# Patient Record
Sex: Female | Born: 1990 | Race: White | Hispanic: No | Marital: Single | Smoking: Light tobacco smoker
Health system: Southern US, Community
[De-identification: ages and names within clinical notes are randomized; demographics above are authoritative.]

## PROBLEM LIST (undated history)

## (undated) DIAGNOSIS — F329 Major depressive disorder, single episode, unspecified: Secondary | ICD-10-CM

## (undated) DIAGNOSIS — F32A Depression, unspecified: Secondary | ICD-10-CM

## (undated) DIAGNOSIS — F419 Anxiety disorder, unspecified: Secondary | ICD-10-CM

## (undated) HISTORY — DX: Major depressive disorder, single episode, unspecified: F32.9

## (undated) HISTORY — PX: OTHER SURGICAL HISTORY: SHX169

## (undated) HISTORY — DX: Depression, unspecified: F32.A

## (undated) HISTORY — PX: FRACTURE SURGERY: SHX138

---
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
## 2009-07-09 ENCOUNTER — Emergency Department (HOSPITAL_COMMUNITY): Admission: EM | Admit: 2009-07-09 | Discharge: 2009-07-09 | Payer: Self-pay | Admitting: Family Medicine

## 2010-12-14 LAB — DIFFERENTIAL
Eosinophils Absolute: 0.1
Lymphs Abs: 1.5
Neutrophils Relative %: 85 — ABNORMAL HIGH

## 2010-12-14 LAB — URINALYSIS, ROUTINE W REFLEX MICROSCOPIC
Leukocytes, UA: NEGATIVE
Nitrite: NEGATIVE
Specific Gravity, Urine: 1.017
Urobilinogen, UA: 0.2

## 2010-12-14 LAB — PREGNANCY, URINE: Preg Test, Ur: NEGATIVE

## 2010-12-14 LAB — BASIC METABOLIC PANEL
BUN: 8
Calcium: 9.7
Chloride: 101
Creatinine, Ser: 1.13

## 2010-12-14 LAB — CBC
MCV: 92.6
Platelets: 230
WBC: 13.7 — ABNORMAL HIGH

## 2010-12-14 LAB — URINE MICROSCOPIC-ADD ON

## 2013-02-27 ENCOUNTER — Emergency Department (HOSPITAL_COMMUNITY)
Admission: EM | Admit: 2013-02-27 | Discharge: 2013-02-27 | Disposition: A | Discharge status: Home / Self Care | Payer: Managed Care, Other (non HMO) | Attending: Emergency Medicine | Admitting: Emergency Medicine

## 2013-02-27 ENCOUNTER — Emergency Department (HOSPITAL_COMMUNITY): Payer: Managed Care, Other (non HMO)

## 2013-02-27 ENCOUNTER — Encounter (HOSPITAL_COMMUNITY): Payer: Self-pay | Admitting: Emergency Medicine

## 2013-02-27 DIAGNOSIS — S8992XA Unspecified injury of left lower leg, initial encounter: Secondary | ICD-10-CM

## 2013-02-27 DIAGNOSIS — S8990XA Unspecified injury of unspecified lower leg, initial encounter: Secondary | ICD-10-CM | POA: Insufficient documentation

## 2013-02-27 DIAGNOSIS — X500XXA Overexertion from strenuous movement or load, initial encounter: Secondary | ICD-10-CM | POA: Insufficient documentation

## 2013-02-27 DIAGNOSIS — M25462 Effusion, left knee: Secondary | ICD-10-CM

## 2013-02-27 DIAGNOSIS — Z79899 Other long term (current) drug therapy: Secondary | ICD-10-CM | POA: Insufficient documentation

## 2013-02-27 DIAGNOSIS — Y929 Unspecified place or not applicable: Secondary | ICD-10-CM | POA: Insufficient documentation

## 2013-02-27 DIAGNOSIS — F411 Generalized anxiety disorder: Secondary | ICD-10-CM | POA: Insufficient documentation

## 2013-02-27 DIAGNOSIS — Y9389 Activity, other specified: Secondary | ICD-10-CM | POA: Insufficient documentation

## 2013-02-27 HISTORY — DX: Anxiety disorder, unspecified: F41.9

## 2013-02-27 MED ORDER — IBUPROFEN 200 MG PO TABS
600.0000 mg | ORAL_TABLET | Freq: Once | ORAL | Status: AC
  Administered 2013-02-27: 600 mg via ORAL
  Filled 2013-02-27: qty 3

## 2013-02-27 MED ORDER — HYDROCODONE-ACETAMINOPHEN 5-325 MG PO TABS: ORAL_TABLET | ORAL | Status: DC

## 2013-02-27 NOTE — ED Provider Notes (Signed)
Medical screening examination/treatment/procedure(s) were performed by non-physician practitioner and as supervising physician I was immediately available for consultation/collaboration.  EKG Interpretation   None         Lyanne Co, MD 02/27/13 1152

## 2013-02-27 NOTE — ED Notes (Signed)
Pt stepped wrong last night, left knee felt like it come out of place. Knee back in place but swollen and painful

## 2013-02-27 NOTE — ED Notes (Signed)
Ice pack was given as well.

## 2013-02-27 NOTE — ED Provider Notes (Signed)
CSN: [id number]     Arrival date & time 02/27/13  1191 History   First MD Initiated Contact with Patient 02/27/13 1041     Chief Complaint  Patient presents with  . Knee Injury   (Consider location/radiation/quality/duration/timing/severity/associated sxs/prior Treatment) HPI Comments: Patient with history of left knee dislocations, no prior surgeries, past care provided by Dr. Lequita Halt -- presents with complaints of left knee pain and swelling. Patient states that she stepped awkwardly yesterday and twisted left knee. She states that it feels like it dislocated and relocated. Patient complains of swelling and pain. She is able to bear weight, however it is uncomfortable to walk. She has taken ibuprofen and applied ice to the knee with little relief. She is concerned that she tore ligaments. No numbness, tingling, weakness of her foot or ankle. No skin color change. Onset of symptoms acute. Course is constant.  The history is provided by the patient.    Past Medical History  Diagnosis Date  . Anxiety    History reviewed. No pertinent past surgical history. No family history on file. History  Substance Use Topics  . Smoking status: Never Smoker   . Smokeless tobacco: Not on file  . Alcohol Use: Yes     Comment: social   OB History   Grav Para Term Preterm Abortions TAB SAB Ect Mult Living                 Review of Systems  Constitutional: Positive for activity change.  Musculoskeletal: Positive for arthralgias, gait problem and joint swelling. Negative for back pain and neck pain.  Skin: Negative for wound.  Neurological: Negative for weakness and numbness.    Allergies  Monistat  Home Medications   Current Outpatient Rx  Name  Route  Sig  Dispense  Refill  . amphetamine-dextroamphetamine (ADDERALL) 20 MG tablet   Oral   Take 20 mg by mouth daily.          . clonazePAM (KLONOPIN) 0.5 MG tablet   Oral   Take 0.5 mg by mouth 2 (two) times daily as needed for anxiety.           Marland Kitchen escitalopram (LEXAPRO) 20 MG tablet   Oral   Take 20 mg by mouth daily.          Marland Kitchen ibuprofen (ADVIL,MOTRIN) 200 MG tablet   Oral   Take 600 mg by mouth every 6 (six) hours as needed for mild pain or moderate pain.         Marland Kitchen norethindrone-ethinyl estradiol (MICROGESTIN,JUNEL,LOESTRIN) 1-20 MG-MCG tablet   Oral   Take 1 tablet by mouth daily.           BP 116/66  Pulse 73  Temp(Src) 98.4 F (36.9 C) (Oral)  Resp 18  SpO2 99%  LMP 02/26/2013 Physical Exam  Nursing note and vitals reviewed. Constitutional: She appears well-developed and well-nourished.  HENT:  Head: Normocephalic and atraumatic.  Eyes: Pupils are equal, round, and reactive to light.  Neck: Normal range of motion. Neck supple.  Cardiovascular: Exam reveals no decreased pulses.   Pulses:      Dorsalis pedis pulses are 2+ on the right side, and 2+ on the left side.       Posterior tibial pulses are 2+ on the right side, and 2+ on the left side.  2+ pedal pulses. Cap refill in toes < 2s.   Musculoskeletal: She exhibits tenderness. She exhibits no edema.       Left hip:  Normal.       Left knee: She exhibits decreased range of motion, swelling and effusion. She exhibits no ecchymosis and no deformity. Tenderness found. Medial joint line and lateral joint line tenderness noted.       Left ankle: Normal.  Compartments of L lower leg are soft and not swollen. Skin is normal color without erythema or cyanosis. No tenderness of calf or ankle.   Neurological: She is alert. No sensory deficit.  Motor, sensation, and vascular distal to the injury is fully intact.   Skin: Skin is warm and dry.  Psychiatric: She has a normal mood and affect.    ED Course  Procedures (including critical care time) Labs Review Labs Reviewed - No data to display Imaging Review Dg Knee Complete 4 Views Left  02/27/2013   CLINICAL DATA:  Status post twisting injury now with limited range of motion. History of previous  patellar dislocation.  EXAM: LEFT KNEE - COMPLETE 4+ VIEW  COMPARISON:  None.  FINDINGS: Four views of the left knee reveal the bones to be adequately mineralized. There is no evidence of an acute fracture nor dislocation. No significant degenerative changes demonstrated. I cannot exclude a joint effusion on the lateral film.  IMPRESSION: There is no evidence of an acute fracture nor dislocation. A joint effusion may be present.   Electronically Signed   By: David  [place]   On: 02/27/2013 11:11    EKG Interpretation   None      10:51 AM Patient seen and examined. Work-up initiated. Medications ordered. She has crutches at home. Will provide knee immobilizer.   Vital signs reviewed and are as follows: Filed Vitals:   02/27/13 1028  BP: 116/66  Pulse: 73  Temp: 98.4 F (36.9 C)  Resp: 18   11:43 AM patient informed of results. Counseled on rice protocol. She is to followup with her orthopedist next week.  Patient counseled on use of narcotic pain medications. Counseled not to combine these medications with others containing tylenol. Urged not to drink alcohol, drive, or perform any other activities that requires focus while taking these medications. The patient verbalizes understanding and agrees with the plan.   MDM   1. Knee effusion, left   2. Knee injury, left, initial encounter    Patient with left knee effusion, likely ligamentous injury after possible dislocation/relocation. There are no signs of vascular compromise or compartment syndrome. Rice protocol, pain control indicated with orthopedic followup.    Renne Crigler, PA-C 02/27/13 1145

## 2014-04-09 ENCOUNTER — Ambulatory Visit (INDEPENDENT_AMBULATORY_CARE_PROVIDER_SITE_OTHER): Payer: Managed Care, Other (non HMO) | Admitting: Physician Assistant

## 2014-04-09 VITALS — BP 112/74 | HR 79 | Temp 98.3°F | Resp 16 | Ht 62.0 in | Wt 123.4 lb

## 2014-04-09 DIAGNOSIS — R059 Cough, unspecified: Secondary | ICD-10-CM

## 2014-04-09 DIAGNOSIS — R0981 Nasal congestion: Secondary | ICD-10-CM

## 2014-04-09 DIAGNOSIS — R070 Pain in throat: Secondary | ICD-10-CM

## 2014-04-09 DIAGNOSIS — R05 Cough: Secondary | ICD-10-CM

## 2014-04-09 MED ORDER — MAGIC MOUTHWASH W/LIDOCAINE: 5.0000 mL | Freq: Four times a day (QID) | ORAL | Status: AC | PRN

## 2014-04-09 MED ORDER — BENZONATATE 100 MG PO CAPS: 100.0000 mg | ORAL_CAPSULE | Freq: Three times a day (TID) | ORAL | Status: AC | PRN

## 2014-04-09 MED ORDER — HYDROCOD POLST-CHLORPHEN POLST 10-8 MG/5ML PO LQCR: 5.0000 mL | Freq: Two times a day (BID) | ORAL | Status: AC | PRN

## 2014-04-09 MED ORDER — GUAIFENESIN ER 1200 MG PO TB12: 1.0000 | ORAL_TABLET | Freq: Two times a day (BID) | ORAL | Status: AC | PRN

## 2014-04-09 NOTE — Progress Notes (Signed)
    MRN: [id number] DOB: 23-Jun-1990  Subjective:   Catherine Bond is a 24 y.o. female presenting for chief complaint of Sore Throat  Patient's symptoms began about 5 days ago with scratchy throat, however it has progressively worsened for 3 days.  She has cough, and states that it is productive green sputum in the morning.  She has no production throughout the day however the mucus from her nose is clear.  She has no headches, fever, chills, no ear fullness, ear pain, dyspnea, or sob.  She also denies abdominal pain, n/v, or diarrhea.  She has not taken anything for relief.  She states today, the symptoms feel like they are improving.  She was concerned because she is visiting a home with a newborn.  She works at [employer].  No known sick contacts.    Dorene Grebeatalie has a current medication list which includes the following prescription(s): amphetamine-dextroamphetamine, escitalopram, and clonazepam.  She is allergic to monistat.  Dorene Grebeatalie  has a past medical history of Anxiety and Depression. Also  has past surgical history that includes knee arthoscopy and Fracture surgery.  ROS As in subjective.  Objective:   Vitals: BP 112/74 mmHg  Pulse 79  Temp(Src) 98.3 F (36.8 C) (Oral)  Resp 16  Ht 5\' 2"  (1.575 m)  Wt 123 lb 6.4 oz (55.974 kg)  BMI 22.56 kg/m2  SpO2 96%  LMP 04/04/2014  Physical Exam  Constitutional: She is well-developed, well-nourished, and in no distress. No distress.  HENT:  Head: Normocephalic and atraumatic.  Right Ear: External ear and ear canal normal.  Left Ear: Tympanic membrane, external ear and ear canal normal.  Nose: Rhinorrhea present. No mucosal edema. Right sinus exhibits no maxillary sinus tenderness and no frontal sinus tenderness. Left sinus exhibits no maxillary sinus tenderness and no frontal sinus tenderness.  Mouth/Throat: No trismus in the jaw. No uvula swelling. No oropharyngeal exudate or posterior oropharyngeal edema. Posterior  oropharyngeal erythema: Very mildy erythematous.  Eyes: Conjunctivae are normal. Pupils are equal, round, and reactive to light.  Cardiovascular: Normal rate and regular rhythm.  Exam reveals no gallop, no distant heart sounds and no friction rub.   No murmur heard. Pulmonary/Chest: Breath sounds normal. No accessory muscle usage. No apnea. No respiratory distress. She has no decreased breath sounds. She has no wheezes.  Lymphadenopathy:       Head (right side): No submandibular, no tonsillar, no preauricular, no posterior auricular and no occipital adenopathy present.       Head (left side): No submandibular, no tonsillar, no preauricular, no posterior auricular and no occipital adenopathy present.    She has no cervical adenopathy.  Skin: Skin is warm and dry. She is not diaphoretic.    Assessment and Plan :   24 year old female is here today for chief complaint of sorethroat.  This is an upper respiratory infection most likely of viral etiology.  Lungs are clear.  Nasal mucus is clear.  Will treat supportively, and may treat with abx if last more than 4 additional days, however this appears that is is clearing up.  Cough - Plan: Guaifenesin (MUCINEX MAXIMUM STRENGTH) 1200 MG TB12, benzonatate (TESSALON) 100 MG capsule, chlorpheniramine-HYDROcodone (TUSSIONEX PENNKINETIC ER) 10-8 MG/5ML LQCR  Nasal congestion - Plan: Guaifenesin (MUCINEX MAXIMUM STRENGTH) 1200 MG TB12  Throat pain - Plan: Alum & Mag Hydroxide-Simeth (MAGIC MOUTHWASH W/LIDOCAINE) SOLN  Trena PlattStephanie English, PA-C [facility] 2/6/20169:54 PM

## 2015-01-18 IMAGING — CR DG KNEE COMPLETE 4+V*L*
4 series · 4 of 4 positions shown · non-contrast
Comparison: None.

CLINICAL DATA: Status post twisting injury now with limited range
of motion. History of previous patellar dislocation.

EXAM:
LEFT KNEE - COMPLETE 4+ VIEW

[t knee ap left]
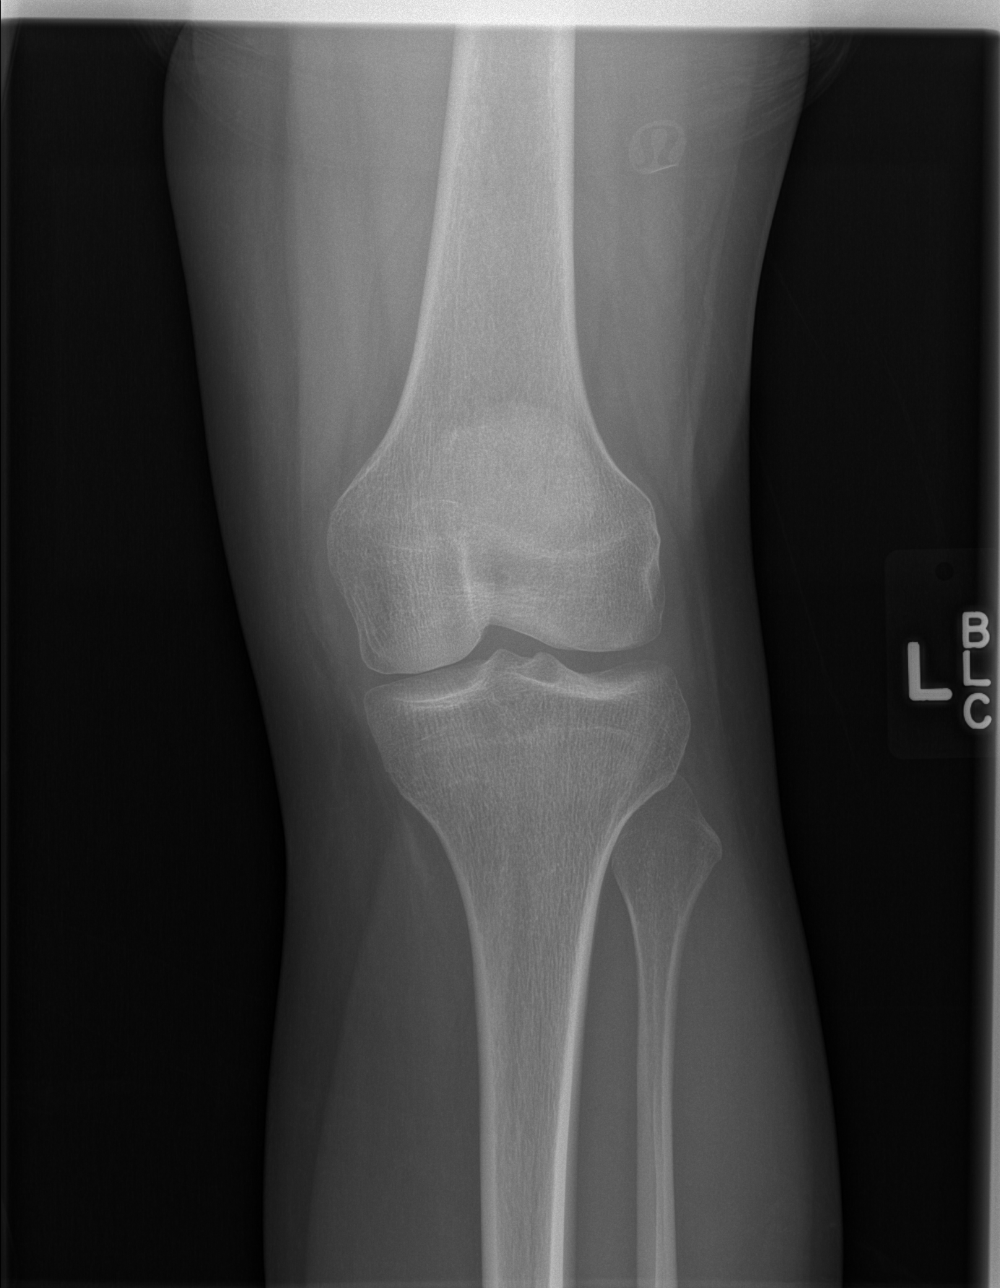

[t knee obl left (1 of 2)]
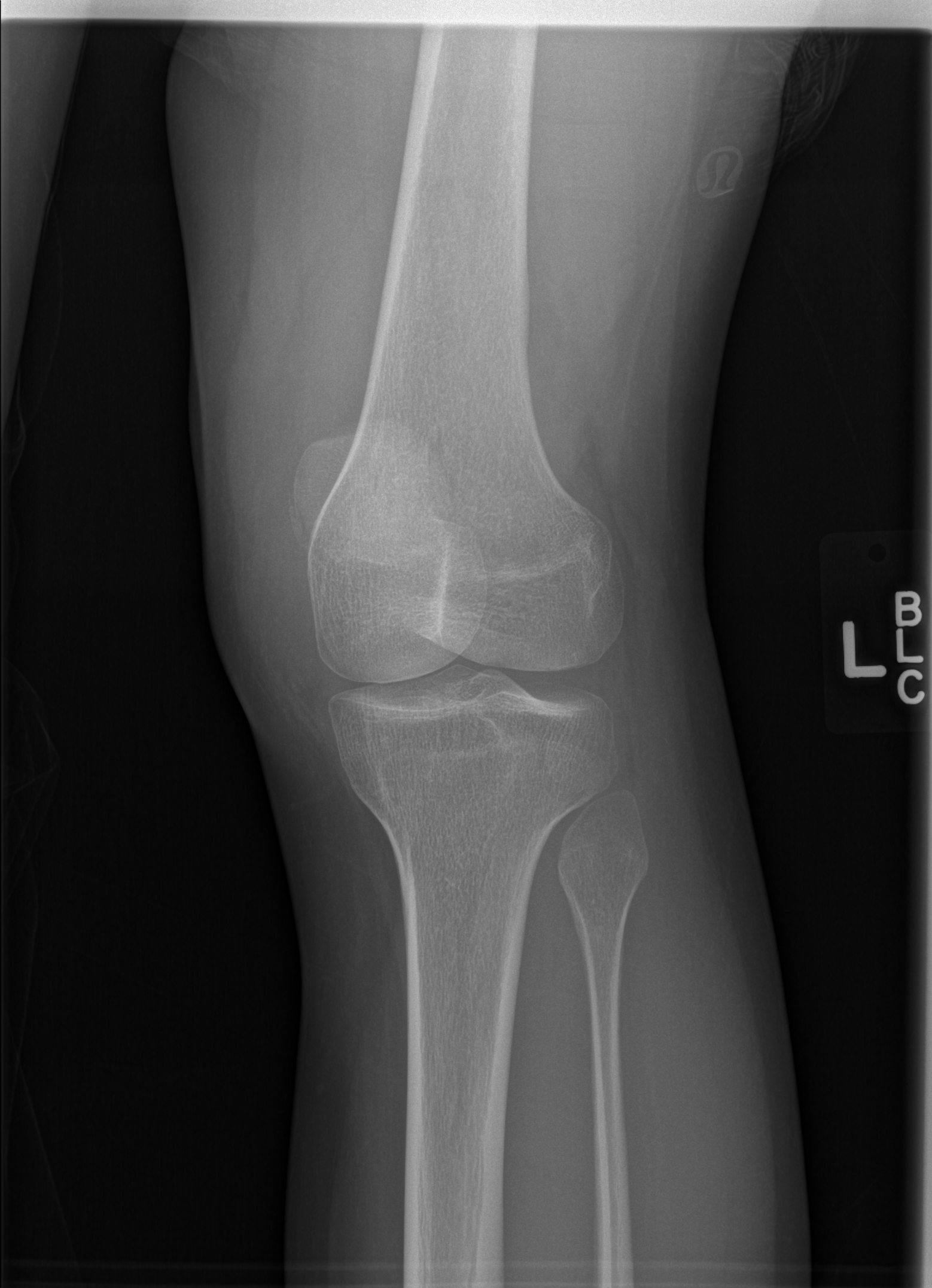

[t knee obl left (2 of 2)]
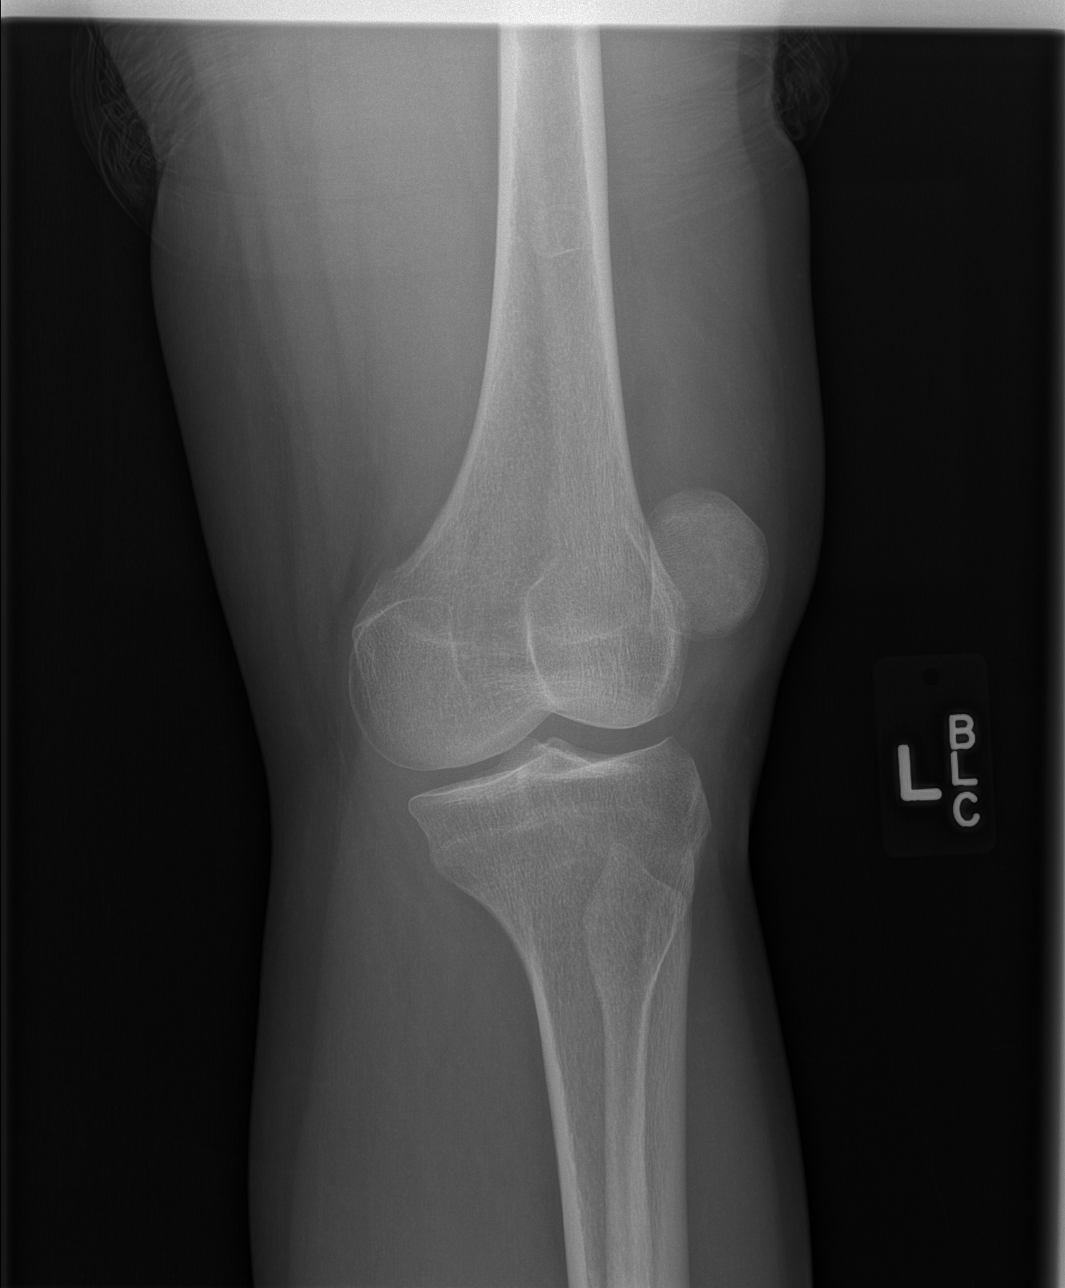

[t knee lat left]
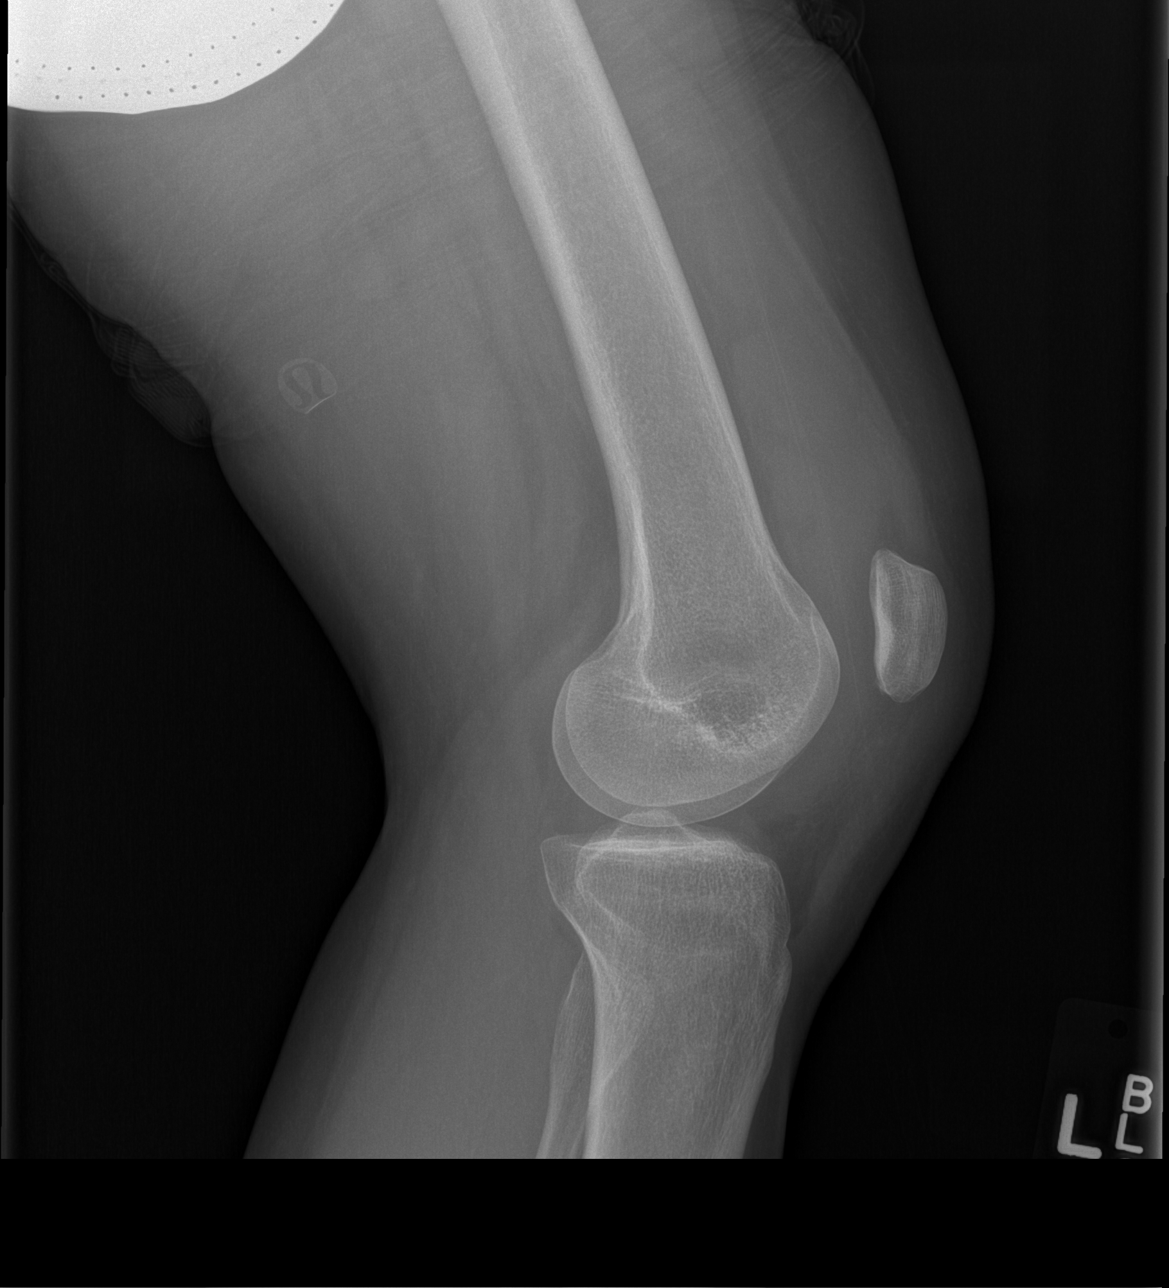

[4 of 4 positions shown; findings below may reference images not displayed]

FINDINGS: Four views of the left knee reveal the bones to be adequately
mineralized. There is no evidence of an acute fracture nor
dislocation. No significant degenerative changes demonstrated. I
cannot exclude a joint effusion on the lateral film.
IMPRESSION: There is no evidence of an acute fracture nor dislocation. A joint
effusion may be present.
